# Patient Record
Sex: Male | Born: 1995 | Race: White | Hispanic: No | Marital: Single | Smoking: Never smoker
Health system: Southern US, Community
[De-identification: ages and names within clinical notes are randomized; demographics above are authoritative.]

## PROBLEM LIST (undated history)

## (undated) DIAGNOSIS — Z789 Other specified health status: Secondary | ICD-10-CM

---

## 2017-09-20 ENCOUNTER — Encounter: Payer: Self-pay | Admitting: Emergency Medicine

## 2017-09-20 ENCOUNTER — Ambulatory Visit (INDEPENDENT_AMBULATORY_CARE_PROVIDER_SITE_OTHER): Payer: Commercial Managed Care - PPO

## 2017-09-20 ENCOUNTER — Other Ambulatory Visit: Payer: Self-pay

## 2017-09-20 ENCOUNTER — Ambulatory Visit
Admission: EM | Admit: 2017-09-20 | Discharge: 2017-09-20 | Disposition: A | Discharge status: Home / Self Care | Payer: Commercial Managed Care - PPO | Attending: Emergency Medicine | Admitting: Emergency Medicine

## 2017-09-20 DIAGNOSIS — R1084 Generalized abdominal pain: Secondary | ICD-10-CM

## 2017-09-20 DIAGNOSIS — M545 Low back pain: Secondary | ICD-10-CM

## 2017-09-20 HISTORY — DX: Other specified health status: Z78.9

## 2017-09-20 LAB — URINALYSIS, COMPLETE (UACMP) WITH MICROSCOPIC
BACTERIA UA: NONE SEEN
BILIRUBIN URINE: NEGATIVE
GLUCOSE, UA: NEGATIVE mg/dL
Hgb urine dipstick: NEGATIVE
KETONES UR: NEGATIVE mg/dL
LEUKOCYTES UA: NEGATIVE
Nitrite: NEGATIVE
PH: 6.5 (ref 5.0–8.0)
PROTEIN: NEGATIVE mg/dL
RBC / HPF: NONE SEEN RBC/hpf (ref 0–5)
SQUAMOUS EPITHELIAL / LPF: NONE SEEN
Specific Gravity, Urine: 1.02 (ref 1.005–1.030)

## 2017-09-20 LAB — COMPREHENSIVE METABOLIC PANEL
ALBUMIN: 4.4 g/dL (ref 3.5–5.0)
ALT: 26 U/L (ref 17–63)
AST: 24 U/L (ref 15–41)
Alkaline Phosphatase: 56 U/L (ref 38–126)
Anion gap: 8 (ref 5–15)
BUN: 15 mg/dL (ref 6–20)
CHLORIDE: 102 mmol/L (ref 101–111)
CO2: 27 mmol/L (ref 22–32)
CREATININE: 0.92 mg/dL (ref 0.61–1.24)
Calcium: 8.9 mg/dL (ref 8.9–10.3)
GFR calc Af Amer: >60 mL/min (ref >60)
Glucose, Bld: 97 mg/dL (ref 65–99)
POTASSIUM: 4.1 mmol/L (ref 3.5–5.1)
SODIUM: 137 mmol/L (ref 135–145)
Total Bilirubin: 0.7 mg/dL (ref 0.3–1.2)
Total Protein: 7.1 g/dL (ref 6.5–8.1)

## 2017-09-20 LAB — CBC WITH DIFFERENTIAL/PLATELET
BASOS ABS: 0 10*3/uL (ref 0–0.1)
BASOS PCT: 1 %
EOS ABS: 0.2 10*3/uL (ref 0–0.7)
EOS PCT: 4 %
HCT: 44.9 % (ref 40.0–52.0)
Hemoglobin: 15.6 g/dL (ref 13.0–18.0)
LYMPHS PCT: 22 %
Lymphs Abs: 1.1 10*3/uL (ref 1.0–3.6)
MCH: 30.2 pg (ref 26.0–34.0)
MCHC: 34.8 g/dL (ref 32.0–36.0)
MCV: 86.7 fL (ref 80.0–100.0)
MONO ABS: 0.8 10*3/uL (ref 0.2–1.0)
Monocytes Relative: 15 %
Neutro Abs: 3.1 10*3/uL (ref 1.4–6.5)
Neutrophils Relative %: 58 %
PLATELETS: 136 10*3/uL — AB (ref 150–440)
RBC: 5.18 MIL/uL (ref 4.40–5.90)
RDW: 12.6 % (ref 11.5–14.5)
WBC: 5.2 10*3/uL (ref 3.8–10.6)

## 2017-09-20 LAB — OCCULT BLOOD X 1 CARD TO LAB, STOOL: Fecal Occult Bld: NEGATIVE

## 2017-09-20 LAB — LIPASE, BLOOD: LIPASE: 19 U/L (ref 11–51)

## 2017-09-20 MED ORDER — IBUPROFEN 600 MG PO TABS: 600.0000 mg | ORAL_TABLET | Freq: Four times a day (QID) | ORAL | 0 refills | Status: AC | PRN

## 2017-09-20 NOTE — ED Provider Notes (Signed)
HPI  SUBJECTIVE:  Arthur LemmingDavid Behan Jr. is a 21 y.o. male who presents with diffuse abdominal pain, slightly worse in his lower abdomen for the past 2-3 days.  He describes it as cramping, intermittent, lasting seconds and then resolving.  States that it migrates to the left lower back, but is otherwise nonmigratory.  He reports bloating, anorexia, but states that he is tolerating p.o.  States that his last 2 bowel movements were black, but he took Pepto-Bismol yesterday.  He also tried bisacodyl laxative, Tums without improvement of symptoms.  Symptoms are slightly worse with having a bowel movement.  It is not associated with urination, movement, walking, drinking, eating, fasting.  States that the car ride over here was slightly painful when he hit bumps.  He denies nausea, vomiting, fevers, abdominal distention.  His last bowel movement was an hour and a half prior to evaluation.  No hematochezia.  No penile rash, discharge, penile or testicular pain.  No scrotal erythema, edema.  No dysuria, urgency, frequency, cloudy, odorous urine, hematuria.  No chest pain, palpitations, shortness of breath.  He drinks socially, has not drank a lot recently.  Has a history of UTI in childhood.  No history of pyelonephritis, nephrolithiasis.  No history of diabetes, hypertension, pancreatitis, excess NSAID use.  No history of gallbladder disease, appendicitis, abdominal surgeries.  No history of atrial fibrillation, hypercoagulability, mesenteric ischemia, diverticulitis, diverticulosis.  No history of gonorrhea, chlamydia, HIV, HSV, syphilis, Trichomonas.  Family history negative for nephrolithiasis.  PMD: None.   Past Medical History:  Diagnosis Date  . No known health problems     History reviewed. No pertinent surgical history.  Family History  Problem Relation Age of Onset  . Healthy Mother   . Hypertension Father     Social History   Tobacco Use  . Smoking status: Never Smoker  . Smokeless tobacco:  Former Engineer, waterUser  Substance Use Topics  . Alcohol use: Yes    Comment: socially  . Drug use: No    No current facility-administered medications for this encounter.   Current Outpatient Medications:  .  ibuprofen (ADVIL,MOTRIN) 600 MG tablet, Take 1 tablet (600 mg total) by mouth every 6 (six) hours as needed., Disp: 30 tablet, Rfl: 0  No Known Allergies   ROS  As noted in HPI.   Physical Exam  BP 117/66 (BP Location: Left Arm)   Pulse 86   Temp 98.1 F (36.7 C) (Oral)   Resp 16   Ht 5\' 8"  (1.727 m)   Wt 190 lb (86.2 kg)   SpO2 99%   BMI 28.89 kg/m   Constitutional: Well developed, well nourished, no acute distress moving around the room comfortably. Eyes:  EOMI, conjunctiva normal bilaterally HENT: Normocephalic, atraumatic,mucus membranes moist Respiratory: Normal inspiratory effort, lungs clear bilaterally, good air movement Cardiovascular: Normal rate regular rhythm, no murmurs, rubs, gallops. GI: Normal appearance, soft, nontender.  Mild left lower quadrant tenderness with deep palpation.  No guarding, rebound.  Negative tap table test. Back: Questionable left CVA tenderness versus left paralumbar tenderness. GU: Normal circumcised male, testes descended bilaterally.  Penile rash or discharge.  No testicular, epididymal tenderness.  No appreciable inguinal hernia.  Declined chaperone  Rectal: Normal external appearance, normal rectal tone.  Soft brown stool in vault.  Patient declined chaperone. skin: No rash, skin intact Musculoskeletal: no deformities Neurologic: Alert & oriented x 3, no focal neuro deficits Psychiatric: Speech and behavior appropriate   ED Course   Medications - No data  to display  Orders Placed This Encounter  Procedures  . DG Abd Acute W/Chest    Standing Status:   Standing    Number of Occurrences:   1    Order Specific Question:   Reason for Exam (SYMPTOM  OR DIAGNOSIS REQUIRED)    Answer:   r/o obstruction, perforation, nephrolithiasis  L side  . CBC with Differential    Standing Status:   Standing    Number of Occurrences:   1  . Comprehensive metabolic panel    Standing Status:   Standing    Number of Occurrences:   1  . Lipase, blood    Standing Status:   Standing    Number of Occurrences:   1  . Urinalysis, Complete w Microscopic    Standing Status:   Standing    Number of Occurrences:   1  . Occult blood card to lab, stool Provider will collect    Standing Status:   Standing    Number of Occurrences:   1    Order Specific Question:   Specimen to be collected by?    Answer:   Provider will collect    Results for orders placed or performed during the hospital encounter of 09/20/17 (from the past 24 hour(s))  CBC with Differential     Status: Abnormal   Collection Time: 09/20/17  1:34 PM  Result Value Ref Range   WBC 5.2 3.8 - 10.6 K/uL   RBC 5.18 4.40 - 5.90 MIL/uL   Hemoglobin 15.6 13.0 - 18.0 g/dL   HCT 16.1 09.6 - 04.5 %   MCV 86.7 80.0 - 100.0 fL   MCH 30.2 26.0 - 34.0 pg   MCHC 34.8 32.0 - 36.0 g/dL   RDW 40.9 81.1 - 91.4 %   Platelets 136 (L) 150 - 440 K/uL   Neutrophils Relative % 58 %   Neutro Abs 3.1 1.4 - 6.5 K/uL   Lymphocytes Relative 22 %   Lymphs Abs 1.1 1.0 - 3.6 K/uL   Monocytes Relative 15 %   Monocytes Absolute 0.8 0.2 - 1.0 K/uL   Eosinophils Relative 4 %   Eosinophils Absolute 0.2 0 - 0.7 K/uL   Basophils Relative 1 %   Basophils Absolute 0.0 0 - 0.1 K/uL  Comprehensive metabolic panel     Status: None   Collection Time: 09/20/17  1:34 PM  Result Value Ref Range   Sodium 137 135 - 145 mmol/L   Potassium 4.1 3.5 - 5.1 mmol/L   Chloride 102 101 - 111 mmol/L   CO2 27 22 - 32 mmol/L   Glucose, Bld 97 65 - 99 mg/dL   BUN 15 6 - 20 mg/dL   Creatinine, Ser 7.82 0.61 - 1.24 mg/dL   Calcium 8.9 8.9 - 95.6 mg/dL   Total Protein 7.1 6.5 - 8.1 g/dL   Albumin 4.4 3.5 - 5.0 g/dL   AST 24 15 - 41 U/L   ALT 26 17 - 63 U/L   Alkaline Phosphatase 56 38 - 126 U/L   Total Bilirubin 0.7  0.3 - 1.2 mg/dL   GFR calc non Af Amer >60 >60 mL/min   GFR calc Af Amer >60 >60 mL/min   Anion gap 8 5 - 15  Lipase, blood     Status: None   Collection Time: 09/20/17  1:34 PM  Result Value Ref Range   Lipase 19 11 - 51 U/L  Urinalysis, Complete w Microscopic     Status: None  Collection Time: 09/20/17  1:34 PM  Result Value Ref Range   Color, Urine YELLOW YELLOW   APPearance CLEAR CLEAR   Specific Gravity, Urine 1.020 1.005 - 1.030   pH 6.5 5.0 - 8.0   Glucose, UA NEGATIVE NEGATIVE mg/dL   Hgb urine dipstick NEGATIVE NEGATIVE   Bilirubin Urine NEGATIVE NEGATIVE   Ketones, ur NEGATIVE NEGATIVE mg/dL   Protein, ur NEGATIVE NEGATIVE mg/dL   Nitrite NEGATIVE NEGATIVE   Leukocytes, UA NEGATIVE NEGATIVE   Squamous Epithelial / LPF NONE SEEN NONE SEEN   WBC, UA 0-5 0 - 5 WBC/hpf   RBC / HPF NONE SEEN 0 - 5 RBC/hpf   Bacteria, UA NONE SEEN NONE SEEN   Mucus PRESENT   Occult blood card to lab, stool Provider will collect     Status: None   Collection Time: 09/20/17  1:34 PM  Result Value Ref Range   Fecal Occult Bld NEGATIVE NEGATIVE   Dg Abd Acute W/chest  Result Date: 09/20/2017 CLINICAL DATA:  L4 more abdominal pain for to 3 days.  Lumbar pain. EXAM: DG ABDOMEN ACUTE W/ 1V CHEST COMPARISON:  None. FINDINGS: Heart size is normal. Lungs are clear. There is no edema or effusion. The bowel gas pattern is normal. Calcifications projecting over the right-sided the abdomen may be within bowel or peritoneal. These are beyond the shadow of the kidney. These may be vascular. Additional phleboliths are present within the anatomic pelvis. IMPRESSION: 1. No acute cardiopulmonary disease. 2. Normal bowel gas pattern. 3. Calcifications in the right lower quadrant are likely benign. Electronically Signed   By: Marin Roberts M.D.   On: 09/20/2017 14:12    ED Clinical Impression  Generalized abdominal pain   ED Assessment/Plan  Patient declined nausea and pain medications.  Will check  acute abdominal series, CBC, CMP, lipase, UA and Hemoccult.  He does not appear to have a surgical abdomen as he is moving around the room comfortably.  He has no distention, guarding, rebound.  Will reevaluate.  Reviewed imaging independently.  Normal bowel gas pattern.  Calcifications in right lower quadrant.  No free air per my read.,see radiology report for details  Labs reviewed.  CBC, CMP, lipase normal.  Patient has some mucus in his urine, but no nitrite, esterase, hematuria.  Hemoccult negative.  No evidence of pancreatitis, gallbladder disease, testicular torsion.  Doubt GU infection.  Doubt perforation, obstruction.  No evidence of GI bleed with a negative Hemoccult.  No UTI.  This could be a mild diverticulitis given location of tenderness, however patient is rather young for this.  Will send home with a work note for today,, ibuprofen 600 mg take with 1 g of Tylenol 3-4 times a day as needed for pain.  He denies nausea or vomiting.  Will provide a primary care referral list and order primary care referral list for him to follow-up with as needed.  Giving strict ER return precautions.  Discussed labs, imaging, MDM, plan and followup with patient. Discussed sn/sx that should prompt return to the ED. patient agrees with plan.   Meds ordered this encounter  Medications  . ibuprofen (ADVIL,MOTRIN) 600 MG tablet    Sig: Take 1 tablet (600 mg total) by mouth every 6 (six) hours as needed.    Dispense:  30 tablet    Refill:  0    *This clinic note was created using Scientist, clinical (histocompatibility and immunogenetics). Therefore, there may be occasional mistakes despite careful proofreading.   ?   Minahil Quinlivan,  Morrie SheldonAshley, MD 09/20/17 2028

## 2017-09-20 NOTE — ED Notes (Signed)
Bed: MUC07 Expected date: 09/20/17 Expected time:  Means of arrival:  Comments: Appointment online

## 2017-09-20 NOTE — Discharge Instructions (Signed)
°  600 mg of ibuprofen with 1 g of Tylenol 3-4 times a day as needed for pain.  Try a bland diet for now.  Try and drink extra fluids. go immediately to the ER for pain not controlled with Tylenol and ibuprofen, fevers above 100.4, if you get worse, if the pain localizes to a specific point, or for other concerns.  Here is a list of primary care providers who are taking new patients:  Dr. Elizabeth Sauereanna Jones, Dr. Schuyler AmorWilliam Plonk [address] [place] [address] [phone]  [facility] [place] [address]  [place] [address]  [phone]  [facility] [address]  [place], [place] [address] [phone]  [facility] [place] [address]  [phone] [place], [place] [address]  Here are clinics/ other resources who will see you if you do not have insurance. Some have certain criteria that you must meet. Call them and find out what they are:  [facility]: [address] [place] [place], [place] [address] Phone: [phone] Hours: First and Third Saturdays of each Month, 9 a.m. - 1 p.m.  [facility]: [address], [place], [place] [address] Phone: [phone] Hours: Tuesday, 4 p.m. - 8 p.m. Thursday, 1 p.m. - 8 p.m. Wednesday, 9 a.m. - [facility]  [facility] [address], [place], [place] [address] Phone: [phone] Pharmacy Phone Number: [phone] Dental Phone Number: [phone] [facility] Insurance Help: [phone]  Dental Hours: Monday - Thursday, 8 a.m. - 6 p.m.  [facility] [address] [place], [place] [address] Phone: [phone] Pharmacy Phone Number: [phone] [facility] Insurance Help: [phone]  [facility] [address] [place], [place] [address] Phone: [phone] Pharmacy Phone Number: [phone] [facility] Insurance Help: [phone]  [facility] [address] [place], [place] [address] Phone: [phone] [facility]  Insurance Help: [phone]   [facility] [address] [place], [place] [address] Phone: [phone]  Go to www.goodrx.com to look up your medications. This will give you a list of where you can find your prescriptions at the most affordable prices. Or ask the pharmacist what the cash price is, or if they have any other discount programs available to help make your medication more affordable. This can be less expensive than what you would pay with insurance.

## 2017-09-20 NOTE — ED Triage Notes (Signed)
Patient in today c/o abdominal cramping/pain, bloating and left lower back pain x 2-3 days. Patient took a laxative last night although he hasn't been constipated. Patient states he had some increase in his bowel movements yesterday, but they were formed. Patient denies fever.

## 2017-10-01 ENCOUNTER — Ambulatory Visit (INDEPENDENT_AMBULATORY_CARE_PROVIDER_SITE_OTHER): Payer: Commercial Managed Care - PPO | Admitting: Podiatry

## 2017-10-01 ENCOUNTER — Encounter: Payer: Self-pay | Admitting: Podiatry

## 2017-10-01 DIAGNOSIS — B353 Tinea pedis: Secondary | ICD-10-CM | POA: Diagnosis not present

## 2017-10-01 MED ORDER — CLOTRIMAZOLE-BETAMETHASONE 1-0.05 % EX CREA: 1.0000 "application " | TOPICAL_CREAM | Freq: Two times a day (BID) | CUTANEOUS | 1 refills | Status: AC

## 2017-10-01 MED ORDER — TERBINAFINE HCL 250 MG PO TABS: 250.0000 mg | ORAL_TABLET | Freq: Every day | ORAL | 1 refills | Status: AC

## 2017-10-01 NOTE — Progress Notes (Signed)
   Subjective:    Patient ID: Arthur Lemmingavid Dolby Jr., male    DOB: 05/31/1996, 22 y.o.   MRN: [id number]  HPI    Review of Systems  All other systems reviewed and are negative.      Objective:   Physical Exam        Assessment & Plan:

## 2017-10-04 NOTE — Progress Notes (Signed)
   HPI: 22 year old male presenting today as a new patient with a complaint of a skin lesion to the left great toe that appeared 2-3 weeks ago.  He reports associated itching, peeling skin between the toes of bilateral feet.  He also reports a rash to the medial sides of bilateral feet that occurs during the summertime.  He has used Lotrimin cream in the past with some relief.  Patient is here for further evaluation and treatment.   Past Medical History:  Diagnosis Date  . No known health problems      Physical Exam: General: The patient is alert and oriented x3 in no acute distress.  Dermatology: Pruritus to bilateral feet with hyperkeratosis. Skin is warm, dry and supple bilateral lower extremities. Negative for open lesions or macerations.  Vascular: Palpable pedal pulses bilaterally. No edema or erythema noted. Capillary refill within normal limits.  Neurological: Epicritic and protective threshold grossly intact bilaterally.   Musculoskeletal Exam: Range of motion within normal limits to all pedal and ankle joints bilateral. Muscle strength 5/5 in all groups bilateral.    Assessment: -Tinea pedis bilateral feet   Plan of Care:  -Patient evaluated. -Prescription for Lamisil 250 mg # 28 provided to patient. -Prescription for Lotrisone cream provided to patient. -Return to clinic as needed.   Felecia ShellingBrent M. Evans, DPM [facility]  Dr. Felecia ShellingBrent M. Evans, DPM    [address]                                        [place], [place] [address]                Office [phone]  Fax [phone]

## 2018-06-02 ENCOUNTER — Emergency Department (HOSPITAL_COMMUNITY)
Admission: EM | Admit: 2018-06-02 | Discharge: 2018-06-03 | Disposition: A | Discharge status: Home / Self Care | Payer: Commercial Managed Care - PPO | Attending: Emergency Medicine | Admitting: Emergency Medicine

## 2018-06-02 ENCOUNTER — Encounter (HOSPITAL_COMMUNITY): Payer: Self-pay | Admitting: Emergency Medicine

## 2018-06-02 ENCOUNTER — Other Ambulatory Visit: Payer: Self-pay

## 2018-06-02 DIAGNOSIS — Z5321 Procedure and treatment not carried out due to patient leaving prior to being seen by health care provider: Secondary | ICD-10-CM | POA: Diagnosis not present

## 2018-06-02 DIAGNOSIS — R110 Nausea: Secondary | ICD-10-CM | POA: Diagnosis not present

## 2018-06-02 DIAGNOSIS — R51 Headache: Secondary | ICD-10-CM | POA: Insufficient documentation

## 2018-06-02 NOTE — ED Triage Notes (Signed)
Pt c/o intermittent headaches with nausea x 1 week. A&O x 4, no neuro deficits noted.

## 2018-06-03 NOTE — ED Notes (Signed)
Pt no longer in the waiting area.

## 2018-06-03 NOTE — ED Notes (Signed)
No answer for vitals recheck x1 

## 2018-06-03 NOTE — ED Notes (Signed)
No answer for vitals recheck x2 

## 2018-06-03 NOTE — ED Notes (Signed)
No answer for vitals recheck x3 

## 2019-01-23 ENCOUNTER — Encounter (HOSPITAL_COMMUNITY): Payer: Self-pay | Admitting: Emergency Medicine

## 2019-01-23 ENCOUNTER — Emergency Department (HOSPITAL_COMMUNITY)
Admission: EM | Admit: 2019-01-23 | Discharge: 2019-01-23 | Disposition: A | Discharge status: Home / Self Care | Payer: Commercial Managed Care - PPO | Attending: Emergency Medicine | Admitting: Emergency Medicine

## 2019-01-23 ENCOUNTER — Other Ambulatory Visit: Payer: Self-pay

## 2019-01-23 ENCOUNTER — Emergency Department (HOSPITAL_COMMUNITY): Payer: Commercial Managed Care - PPO

## 2019-01-23 DIAGNOSIS — R109 Unspecified abdominal pain: Secondary | ICD-10-CM | POA: Diagnosis present

## 2019-01-23 DIAGNOSIS — Z79899 Other long term (current) drug therapy: Secondary | ICD-10-CM | POA: Diagnosis not present

## 2019-01-23 DIAGNOSIS — Y999 Unspecified external cause status: Secondary | ICD-10-CM | POA: Diagnosis not present

## 2019-01-23 DIAGNOSIS — M25522 Pain in left elbow: Secondary | ICD-10-CM | POA: Insufficient documentation

## 2019-01-23 DIAGNOSIS — Y92410 Unspecified street and highway as the place of occurrence of the external cause: Secondary | ICD-10-CM | POA: Diagnosis not present

## 2019-01-23 DIAGNOSIS — T148XXA Other injury of unspecified body region, initial encounter: Secondary | ICD-10-CM | POA: Insufficient documentation

## 2019-01-23 DIAGNOSIS — Y9389 Activity, other specified: Secondary | ICD-10-CM | POA: Insufficient documentation

## 2019-01-23 LAB — COMPREHENSIVE METABOLIC PANEL
ALT: 49 U/L — ABNORMAL HIGH (ref 0–44)
AST: 38 U/L (ref 15–41)
Albumin: 4.6 g/dL (ref 3.5–5.0)
Alkaline Phosphatase: 55 U/L (ref 38–126)
Anion gap: 14 (ref 5–15)
BUN: 15 mg/dL (ref 6–20)
CO2: 21 mmol/L — ABNORMAL LOW (ref 22–32)
Calcium: 9.1 mg/dL (ref 8.9–10.3)
Chloride: 106 mmol/L (ref 98–111)
Creatinine, Ser: 0.95 mg/dL (ref 0.61–1.24)
GFR calc Af Amer: >60 mL/min (ref >60)
GFR calc non Af Amer: >60 mL/min (ref >60)
Glucose, Bld: 119 mg/dL — ABNORMAL HIGH (ref 70–99)
Potassium: 3.4 mmol/L — ABNORMAL LOW (ref 3.5–5.1)
Sodium: 141 mmol/L (ref 135–145)
Total Bilirubin: 0.4 mg/dL (ref 0.3–1.2)
Total Protein: 7.1 g/dL (ref 6.5–8.1)

## 2019-01-23 LAB — CDS SEROLOGY

## 2019-01-23 LAB — CBC
HCT: 47 % (ref 39.0–52.0)
Hemoglobin: 16 g/dL (ref 13.0–17.0)
MCH: 30.5 pg (ref 26.0–34.0)
MCHC: 34 g/dL (ref 30.0–36.0)
MCV: 89.7 fL (ref 80.0–100.0)
Platelets: 165 10*3/uL (ref 150–400)
RBC: 5.24 MIL/uL (ref 4.22–5.81)
RDW: 12.1 % (ref 11.5–15.5)
WBC: 8.4 10*3/uL (ref 4.0–10.5)
nRBC: 0 % (ref 0.0–0.2)

## 2019-01-23 LAB — ETHANOL: Alcohol, Ethyl (B): 232 mg/dL — ABNORMAL HIGH (ref <10)

## 2019-01-23 LAB — TYPE AND SCREEN
ABO/RH(D): B POS
Antibody Screen: NEGATIVE

## 2019-01-23 LAB — ABO/RH: ABO/RH(D): B POS

## 2019-01-23 MED ORDER — IOHEXOL 300 MG/ML  SOLN
100.0000 mL | Freq: Once | INTRAMUSCULAR | Status: AC | PRN
  Administered 2019-01-23: 22:00:00 100 mL via INTRAVENOUS

## 2019-01-23 MED ORDER — ONDANSETRON HCL 4 MG/2ML IJ SOLN
4.0000 mg | Freq: Once | INTRAMUSCULAR | Status: AC
  Administered 2019-01-23: 4 mg via INTRAVENOUS
  Filled 2019-01-23: qty 2
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
## 2023-09-07 ENCOUNTER — Other Ambulatory Visit: Payer: Self-pay | Admitting: Gastroenterology

## 2023-09-07 DIAGNOSIS — R109 Unspecified abdominal pain: Secondary | ICD-10-CM

## 2023-09-13 ENCOUNTER — Ambulatory Visit
Admission: RE | Admit: 2023-09-13 | Discharge: 2023-09-13 | Disposition: A | Discharge status: Home / Self Care | Payer: 59 | Source: Ambulatory Visit | Attending: Gastroenterology | Admitting: Gastroenterology

## 2023-09-13 DIAGNOSIS — R109 Unspecified abdominal pain: Secondary | ICD-10-CM
